# Patient Record
Sex: Male | Born: 1985 | Race: White | Hispanic: No | Marital: Married | Smoking: Former smoker
Health system: Southern US, Community
[De-identification: ages and names within clinical notes are randomized; demographics above are authoritative.]

---
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
## 2009-02-07 ENCOUNTER — Emergency Department (HOSPITAL_COMMUNITY): Admission: EM | Admit: 2009-02-07 | Discharge: 2009-02-07 | Payer: Self-pay | Admitting: Emergency Medicine

## 2009-10-08 ENCOUNTER — Ambulatory Visit: Payer: Self-pay | Admitting: Family Medicine

## 2010-05-14 NOTE — Letter (Signed)
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
## 2011-07-21 ENCOUNTER — Encounter: Payer: Self-pay | Admitting: Family

## 2011-07-21 ENCOUNTER — Ambulatory Visit (INDEPENDENT_AMBULATORY_CARE_PROVIDER_SITE_OTHER): Payer: BC Managed Care – PPO | Admitting: Family

## 2011-07-21 DIAGNOSIS — J309 Allergic rhinitis, unspecified: Secondary | ICD-10-CM

## 2011-07-21 DIAGNOSIS — J029 Acute pharyngitis, unspecified: Secondary | ICD-10-CM

## 2011-07-21 MED ORDER — FEXOFENADINE-PSEUDOEPHED ER 180-240 MG PO TB24: 1.0000 | ORAL_TABLET | Freq: Every day | ORAL | Status: AC

## 2011-07-21 NOTE — Progress Notes (Signed)
  Subjective:    Patient ID: Edward Perry, male    DOB: 03-Dec-1985, 26 y.o.   MRN: [id number]  HPI Comments: 26 yo white male presents with c/o sorethroat, chills, sinus pressure and drainage, and intermittent headaches started Sat.  Took OTC zyrtec with no relief.      Review of Systems  Constitutional: Negative.   HENT: Positive for sore throat, postnasal drip and sinus pressure. Negative for hearing loss, ear pain, nosebleeds, congestion, facial swelling, sneezing, drooling, mouth sores, trouble swallowing, neck pain, neck stiffness, dental problem, voice change, tinnitus and ear discharge.   Eyes: Negative.   Respiratory: Negative.   Cardiovascular: Negative.    No past medical history on file.  History   Social History  . Marital Status: Married    Spouse Name: N/A    Number of Children: N/A  . Years of Education: N/A   Occupational History  . Not on file.   Social History Main Topics  . Smoking status: Former Games developer  . Smokeless tobacco: Not on file  . Alcohol Use: Yes  . Drug Use: No  . Sexually Active: Not on file   Other Topics Concern  . Not on file   Social History Narrative  . No narrative on file    No past surgical history on file.  No family history on file.  Allergies  Allergen Reactions  . Erythromycin     No current outpatient prescriptions on file prior to visit.    BP 112/80  Temp(Src) 98.4 F (36.9 C) (Oral)  Ht 6\' 1"  (1.854 m)  Wt 194 lb (87.998 kg)  BMI 25.60 kg/m2chart     Objective:   Physical Exam  Constitutional: He is oriented to person, place, and time. He appears well-developed and well-nourished. No distress.  HENT:  Right Ear: External ear normal.  Left Ear: External ear normal.  Nose: Nose normal.  Mouth/Throat: Oropharyngeal exudate present.  Eyes: Conjunctivae are normal. Right eye exhibits no discharge. Left eye exhibits no discharge.  Cardiovascular: Normal rate, regular rhythm, normal heart sounds and intact  distal pulses.  Exam reveals no gallop.   No murmur heard. Pulmonary/Chest: Effort normal and breath sounds normal. No respiratory distress. He has no wheezes. He has no rales. He exhibits no tenderness.  Neurological: He is alert and oriented to person, place, and time.  Skin: Skin is warm and dry. He is not diaphoretic.          Assessment & Plan:  Assessment: Pharyngitis, Cough  Plan: Allegra-d, rest and increase po fluids. OTC tylenol for headache relief. Gargle with warm salt water. Teaching handout on diagnosis and treatment provided. Encouraged to RTC if s/s get worse

## 2018-01-03 ENCOUNTER — Emergency Department (INDEPENDENT_AMBULATORY_CARE_PROVIDER_SITE_OTHER)
Admission: EM | Admit: 2018-01-03 | Discharge: 2018-01-03 | Disposition: A | Discharge status: Home / Self Care | Source: Home / Self Care | Attending: Family Medicine | Admitting: Family Medicine

## 2018-01-03 ENCOUNTER — Encounter: Payer: Self-pay | Admitting: *Deleted

## 2018-01-03 DIAGNOSIS — R202 Paresthesia of skin: Secondary | ICD-10-CM | POA: Diagnosis not present
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
## 2018-01-03 NOTE — Discharge Instructions (Signed)
°  Please call the orthopedist tomorrow to schedule a follow up visit for consultation on continued care of your hand fracture.

## 2022-01-14 ENCOUNTER — Ambulatory Visit (INDEPENDENT_AMBULATORY_CARE_PROVIDER_SITE_OTHER): Payer: Managed Care, Other (non HMO) | Admitting: Internal Medicine

## 2022-01-14 ENCOUNTER — Ambulatory Visit: Payer: Self-pay | Admitting: Internal Medicine

## 2022-01-14 ENCOUNTER — Encounter: Payer: Self-pay | Admitting: Internal Medicine

## 2022-01-14 VITALS — BP 122/70 | HR 70 | Temp 97.0°F | Resp 16 | Ht 73.0 in | Wt 204.0 lb

## 2022-01-14 DIAGNOSIS — H1013 Acute atopic conjunctivitis, bilateral: Secondary | ICD-10-CM | POA: Diagnosis not present

## 2022-01-14 DIAGNOSIS — J3089 Other allergic rhinitis: Secondary | ICD-10-CM | POA: Diagnosis not present

## 2022-01-14 MED ORDER — FLUTICASONE PROPIONATE 50 MCG/ACT NA SUSP: 2.0000 | Freq: Every day | NASAL | 3 refills | Status: AC

## 2022-01-14 MED ORDER — MONTELUKAST SODIUM 10 MG PO TABS: 10.0000 mg | ORAL_TABLET | Freq: Every day | ORAL | 3 refills | Status: AC

## 2022-01-14 MED ORDER — AZELASTINE HCL 0.1 % NA SOLN: 2.0000 | Freq: Two times a day (BID) | NASAL | 3 refills | Status: AC

## 2022-01-14 NOTE — Progress Notes (Deleted)
NEW PATIENT  Date of Service/Encounter:  01/14/22  Consult requested by: Laurey Morale, MD   Subjective:   Edward Perry (DOB: 1986/04/13) is a 36 y.o. male who presents to the clinic on 01/14/2022 with a chief complaint of No chief complaint on file. Marland Kitchen    History obtained from: chart review and {Persons; PED relatives w/patient:19415::"patient"}.   Asthma:  Diagnosed at age ***.  Current symptoms include {Blank multiple:19196::"***","chest tightness","cough","shortness of breath","wheezing"} *** daytime symptoms in past month, *** nighttime awakenings in past month Using rescue inhaler *** Limitations to daily activity: {Blank single:19197::"none","mild","some","severe"} *** ED visits, *** UC visits and *** oral steroids in the past year *** number of lifetime hospitalizations, *** number of lifetime intubations.  Identified Triggers: {Blank multiple:19196:a:"***","exercise","respiratory illness","smoke exposure","cold air"} Prior PFTs or spirometry: *** Previously used therapies: ***.  Current regimen:  Maintenance: *** Rescue: Albuterol 2 puffs q4-6 hrs PRN, *** prior to exercise  Up-to-date with {Blank multiple:19196:a:"***","pneumonia,","Covid-19,","Flu,"} vaccines. History of prior pneumonias: *** History of prior COVID-19 infection: *** Smoking exposure: ***  Rhinitis:  Started *** Symptoms include: {Blank multiple:19196:a:"***","nasal congestion","rhinorrhea","post nasal drainage","sneezing","watery eyes","itchy eyes","itchy nose"}  Occurs {Blank single:19197::"year-round","seasonally-***","year-round with seasonal flares","***"} Potential triggers: *** Treatments tried: *** Previous allergy testing: {Blank single:19197::"yes","no"} History of reflux/heartburn: {Blank single:19197::"yes","no"} History of chronic sinusitis or sinus surgery: {Blank single:19197::"yes","no"} Nonallergic triggers: {Blank multiple:19196:a:"***","strong odors","smoke","spicy  food","alcohol"}   Atopic Dermatitis:  Diagnosed at age *** Areas that flare commonly are ***. Previous therapies tried *** Current regimen: ***   Reports *** of fragrance/dye free products Identified triggers of flares include *** Sleep *** affected  Eosinophilic Esophagitis: Diagnosed at:*** Symptoms: *** Triggers: *** Tolerating foods: *** Testing:  *** Previous treatments: ***  Concern for Food Allergy:  Foods of concern: *** History of reaction: *** Previous allergy testing {Blank single:19197::"yes","no"} Eats egg, dairy, wheat, soy, fish, shellfish, peanuts, tree nuts, sesame without reactions*** Carries an epinephrine autoinjector: {Blank single:19197::"yes","no"}  Concern for Stinging Insect Allergy: Insect of concern: *** History of reaction: *** Previous testing: *** Carries an epinephrine autoinjector: {Blank single:19197::"yes","no"}  Concern for Drug Allergy: Drug of concern: *** History of reaction: *** Previous testing: *** Carries an epinephrine autoinjector: {Blank single:19197::"yes","no"}  Chronic Urticaria/Angioedema: Symptoms: *** Angioedema Episodes: *** ER visits/clinic visits/hospitalization: *** Triggers: ***  Recurrent Infections:  Age of onset: *** Types of infections: *** Antibiotic use: *** Hospitalization or ER visits: *** Testing performed: ***   Past Medical History: No past medical history on file.  Birth History:  {Blank single:19197::"non-contributory","born premature and spent time in the NICU","born at term without complications"}  Past Surgical History: No past surgical history on file.  Family History: No family history on file.  Social History:  Lives in a *** year {Blank single:19197::"house","condo","apartment","townhouse"} Flooring in bedroom: {Blank single:19197::"carpet","tile","wood","laminate"} Pets: {Blank multiple:19196::"***","cat","dog","other"} Tobacco use/exposure: *** Job: ***  Medication  List:  Allergies as of 01/14/2022       Reactions   Erythromycin         Medication List        Accurate as of January 14, 2022  9:03 AM. If you have any questions, ask your nurse or doctor.          HYDROcodone-acetaminophen 5-325 MG tablet Commonly known as: NORCO/VICODIN Take 1 tablet by mouth every 6 (six) hours as needed for moderate pain.   ibuprofen 600 MG tablet Commonly known as: ADVIL Take 600 mg by mouth every 6 (six) hours as needed.         REVIEW OF SYSTEMS: Pertinent  positives and negatives discussed in HPI.   Objective:   Physical Exam: There were no vitals taken for this visit. There is no height or weight on file to calculate BMI. GEN: alert, well developed HEENT: clear conjunctiva, TM grey and translucent, nose with + inferior turbinate hypertrophy, pale nasal mucosa, slight clear rhinorrhea, + cobblestoning HEART: regular rate and rhythm, no murmur LUNGS: clear to auscultation bilaterally, no coughing, unlabored respiration ABDOMEN: soft, non distended  SKIN: no rashes or lesions  Reviewed: ***  Spirometry:  Tracings reviewed. His effort: {Blank single:19197::"Good reproducible efforts.","It was hard to get consistent efforts and there is a question as to whether this reflects a maximal maneuver.","Poor effort, data can not be interpreted.","Variable effort-results affected.","decent for first attempt at spirometry."} FVC: ***L FEV1: ***L, ***% predicted FEV1/FVC ratio: ***% Interpretation: {Blank single:19197::"Spirometry consistent with mild obstructive disease","Spirometry consistent with moderate obstructive disease","Spirometry consistent with severe obstructive disease","Spirometry consistent with possible restrictive disease","Spirometry consistent with mixed obstructive and restrictive disease","Spirometry uninterpretable due to technique","Spirometry consistent with normal pattern","No overt abnormalities noted given today's efforts"}.   Please see scanned spirometry results for details.  Skin Testing:  Skin prick testing was placed, which includes aeroallergens/foods, histamine control, and saline control.  Verbal consent was obtained prior to placing test.  We discussed risks including anaphylaxis. Patient tolerated procedure well.  Allergy testing results were read and interpreted by myself, documented by clinical staff. Adequate positive and negative control.  Results discussed with patient/family.     Assessment:   No diagnosis found.  Plan/Recommendations:    There are no Patient Instructions on file for this visit.    No follow-ups on file.  Alesia Morin, MD [facility]

## 2022-01-14 NOTE — Progress Notes (Signed)
NEW PATIENT  Date of Service/Encounter:  01/14/22  Consult requested by: Nelwyn Salisbury, MD   Subjective:   Edward Perry (DOB: 09/17/1985) is a 36 y.o. male who presents to the clinic on 01/14/2022 with a chief complaint of Allergy Testing (Environmental: ALL/Food: No Hx) .    History obtained from: chart review and patient.   Rhinitis:  Started around age mid 89s Symptoms include:  headaches, rhinorrhea, post nasal drainage, sneezing, watery eyes, and itchy eyes  Occurs year-round Potential triggers: pollen Treatments tried: AIT in the past with [facility] for about 1-1.5 years around 2019/2020.  He reports they were working well which is why he stopped as he wasn't aware he was supposed to continue.  He wants to restart them as his symptoms are uncontrolled again and he now works in [employer] so it is easier for him to come for shots here.  No systemic reactions to AIT in the past.  He has tried a variety of nasal spray/anti histamines but they do not work.   Previous allergy testing: yes; cant recall results as it was in 2019-2020 History of reflux/heartburn: no History of chronic sinusitis or sinus surgery: no  Denies any history of asthma, food allergies, stinging insect allergies.  Past Medical History: History reviewed. No pertinent past medical history.  Past Surgical History: History reviewed. No pertinent surgical history. L knee surgery Mandible surgery L hand surgery  Family History: History reviewed. No pertinent family history.  Social History:  Lives in a 1970s apartment Flooring in bedroom: carpet Pets: none Tobacco use/exposure: none Job: Engineer, maintenance  Medication List:  Allergies as of 01/14/2022       Reactions   Erythromycin         Medication List        Accurate as of January 14, 2022 12:06 PM. If you have any questions, ask your nurse or doctor.          STOP taking these medications     HYDROcodone-acetaminophen 5-325 MG tablet Commonly known as: NORCO/VICODIN Stopped by: Birder Robson, MD   ibuprofen 600 MG tablet Commonly known as: ADVIL Stopped by: Birder Robson, MD       TAKE these medications    azelastine 0.1 % nasal spray Commonly known as: ASTELIN Place 2 sprays into both nostrils 2 (two) times daily. Started by: Birder Robson, MD   EPINEPHrine 0.3 mg/0.3 mL Soaj injection Commonly known as: EPI-PEN Inject 0.3 mg into the skin as directed.   fluticasone 50 MCG/ACT nasal spray Commonly known as: FLONASE Place 2 sprays into both nostrils daily. Started by: Birder Robson, MD   montelukast 10 MG tablet Commonly known as: Singulair Take 1 tablet (10 mg total) by mouth at bedtime. Started by: Birder Robson, MD         REVIEW OF SYSTEMS: Pertinent positives and negatives discussed in HPI.   Objective:   Physical Exam: BP 122/70   Pulse 70   Temp (!) 97 F (36.1 C)   Resp 16   Ht 6\' 1"  (1.854 m)   Wt 204 lb (92.5 kg)   SpO2 96%   BMI 26.91 kg/m  Body mass index is 26.91 kg/m. GEN: alert, well developed HEENT: clear conjunctiva, TM grey and translucent, nose with + inferior turbinate hypertrophy, pink nasal mucosa, slight clear rhinorrhea, + cobblestoning HEART: regular rate and rhythm, no murmur LUNGS: clear to auscultation bilaterally, no coughing, unlabored respiration ABDOMEN: soft, non  distended  SKIN: no rashes or lesions  Reviewed:  None   Skin Testing:  Skin prick testing was placed, which includes aeroallergens/foods, histamine control, and saline control.  Verbal consent was obtained prior to placing test.  We discussed risks including anaphylaxis. Patient tolerated procedure well.  Allergy testing results were read and interpreted by myself, documented by clinical staff. Adequate positive and negative control.  Results discussed with patient/family.  Airborne Adult Perc - 01/14/22 1029     Time Antigen Placed 1029     Allergen Manufacturer Waynette Buttery    Location Back    Number of Test 59    Panel 1 Select    2. Control-Histamine 1 mg/ml 2+    4. Bahia Negative    5. French Southern Territories Negative    6. Johnson Negative    7. Kentucky Blue Negative    8. Meadow Fescue Negative    9. Perennial Rye Negative    10. Sweet Vernal Negative    11. Timothy Negative    12. Cocklebur Negative    13. Burweed Marshelder Negative    14. Ragweed, short Negative    15. Ragweed, Giant Negative    16. Plantain,  English Negative    17. Lamb's Quarters Negative    18. Sheep Sorrell Negative    19. Rough Pigweed Negative    20. Marsh Elder, Rough Negative    21. Mugwort, Common Negative    22. Ash mix Negative    23. Birch mix Negative    24. Beech American Negative    25. Box, Elder Negative    26. Cedar, red Negative    27. Cottonwood, Guinea-Bissau Negative    28. Elm mix Negative    29. Hickory Negative    30. Maple mix Negative    31. Oak, Guinea-Bissau mix Negative    32. Pecan Pollen Negative    33. Pine mix Negative    34. Sycamore Eastern Negative    35. Walnut, Black Pollen Negative    36. Alternaria alternata Negative    37. Cladosporium Herbarum Negative    38. Aspergillus mix Negative    39. Penicillium mix Negative    40. Bipolaris sorokiniana (Helminthosporium) Negative    41. Drechslera spicifera (Curvularia) Negative    42. Mucor plumbeus Negative    43. Fusarium moniliforme Negative    44. Aureobasidium pullulans (pullulara) Negative    45. Rhizopus oryzae Negative    46. Botrytis cinera Negative    47. Epicoccum nigrum Negative    48. Phoma betae Negative    49. Candida Albicans Negative    50. Trichophyton mentagrophytes Negative    51. Mite, D Farinae  5,000 AU/ml Negative    52. Mite, D Pteronyssinus  5,000 AU/ml 3+    53. Cat Hair 10,000 BAU/ml Negative    54.  Dog Epithelia Negative    55. Mixed Feathers Negative    56. Horse Epithelia Negative    57. Cockroach, German 2+    58. Mouse Negative     59. Tobacco Leaf Negative             Intradermal - 01/14/22 1029     Time Antigen Placed 1029    Location Arm    Number of Test 13    Intradermal Select    Control Negative    French Southern Territories Negative    Johnson Negative    7 Grass Negative    Ragweed mix Negative    Weed mix Negative  Tree mix Negative    Mold 1 Negative    Mold 2 Negative    Mold 3 Negative    Mold 4 Negative    Cat Negative    Dog Negative    Comments n               Assessment:   1. Perennial allergic rhinitis   2. Allergic conjunctivitis of both eyes     Plan/Recommendations:   Allergic Rhinitis Allergic Conjunctivitis  - Uncontrolled will try medical management first and he will also discuss with insurance company regarding cost of AIT in case the medications do not help.   - Avoidance measures discussed. - Use nasal saline rinses before nose sprays such as with Neilmed Sinus Rinse.  Use distilled water.   - Use Flonase 2 sprays each nostril daily.  Aim upward and outward. - Use Azelastine 1-2 sprays each nostril twice daily. Aim upward and outward. - Use Singulair 10mg  daily. - Consider allergy shots as long term control of your symptoms by teaching your immune system to be more tolerant of your allergy triggers.  Please speak with your insurance company regarding coverage and then call us to let us know if you would like to start.  Return in about 8 weeks (around 03/11/2022).  Harlon Flor, MD [facility]

## 2022-01-14 NOTE — Patient Instructions (Addendum)
Rhinitis: - Avoidance measures discussed. - Use nasal saline rinses before nose sprays such as with Neilmed Sinus Rinse.  Use distilled water.   - Use Flonase 2 sprays each nostril daily.  Aim upward and outward. - Use Azelastine 1-2 sprays each nostril twice daily. Aim upward and outward. - Use Singulair 10mg  daily. - Consider allergy shots as long term control of your symptoms by teaching your immune system to be more tolerant of your allergy triggers.  Please speak with your insurance company regarding coverage and then call us to let us know if you would like to start.   ALLERGEN AVOIDANCE MEASURES  Dust Mites Use central air conditioning and heat; and change the filter monthly.  Pleated filters work better than mesh filters.  Electrostatic filters may also be used; wash the filter monthly.  Window air conditioners may be used, but do not clean the air as well as a central air conditioner.  Change or wash the filter monthly. Keep windows closed.  Do not use attic fans.   Encase the mattress, box springs and pillows with zippered, dust proof covers. Wash the bed linens in hot water weekly.   Remove carpet, especially from the bedroom. Remove stuffed animals, throw pillows, dust ruffles, heavy drapes and other items that collect dust from the bedroom. Do not use a humidifier.   Use wood, vinyl or leather furniture instead of cloth furniture in the bedroom. Keep the indoor humidity at 30 - 40%.  Monitor with a humidity gauge.  Cockroach Limit spread of food around the house; especially keep food out of bedrooms. Keep food and garbage in closed containers with a tight lid.  Never leave food out in the kitchen.  Do not leave out pet food or dirty food bowls. Mop the kitchen floor and wash countertops at least once a week. Repair leaky pipes and faucets so there is no standing water to attract roaches. Plug up cracks in the house through which cockroaches can enter. Use bait stations and  approved pesticides to reduce cockroach infestation.

## 2022-03-21 ENCOUNTER — Ambulatory Visit: Payer: Managed Care, Other (non HMO) | Admitting: Internal Medicine

## 2022-03-21 DIAGNOSIS — J309 Allergic rhinitis, unspecified: Secondary | ICD-10-CM

## 2023-09-02 ENCOUNTER — Other Ambulatory Visit: Payer: Self-pay
